# Patient Record
Sex: Male | Born: 1995 | Hispanic: No | Marital: Single | Smoking: Former smoker
Health system: Southern US, Community
[De-identification: ages and names within clinical notes are randomized; demographics above are authoritative.]

## PROBLEM LIST (undated history)

## (undated) HISTORY — PX: FRACTURE SURGERY: SHX138

## (undated) HISTORY — PX: APPENDECTOMY: SHX54

---
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
## 2015-05-18 ENCOUNTER — Other Ambulatory Visit: Payer: Self-pay | Admitting: Orthopaedic Surgery

## 2015-05-18 DIAGNOSIS — M25562 Pain in left knee: Secondary | ICD-10-CM

## 2015-05-27 ENCOUNTER — Ambulatory Visit
Admission: RE | Admit: 2015-05-27 | Discharge: 2015-05-27 | Disposition: A | Discharge status: Home / Self Care | Payer: Medicaid Other | Source: Ambulatory Visit | Attending: Orthopaedic Surgery | Admitting: Orthopaedic Surgery

## 2015-05-27 DIAGNOSIS — M25562 Pain in left knee: Secondary | ICD-10-CM

## 2016-12-22 ENCOUNTER — Emergency Department (HOSPITAL_BASED_OUTPATIENT_CLINIC_OR_DEPARTMENT_OTHER)
Admission: EM | Admit: 2016-12-22 | Discharge: 2016-12-22 | Disposition: A | Discharge status: Home / Self Care | Payer: Worker's Compensation | Attending: Emergency Medicine | Admitting: Emergency Medicine

## 2016-12-22 ENCOUNTER — Encounter (HOSPITAL_BASED_OUTPATIENT_CLINIC_OR_DEPARTMENT_OTHER): Payer: Self-pay | Admitting: *Deleted

## 2016-12-22 ENCOUNTER — Other Ambulatory Visit: Payer: Self-pay

## 2016-12-22 DIAGNOSIS — Y990 Civilian activity done for income or pay: Secondary | ICD-10-CM | POA: Diagnosis not present

## 2016-12-22 DIAGNOSIS — F17200 Nicotine dependence, unspecified, uncomplicated: Secondary | ICD-10-CM | POA: Diagnosis not present

## 2016-12-22 DIAGNOSIS — S0551XA Penetrating wound with foreign body of right eyeball, initial encounter: Secondary | ICD-10-CM

## 2016-12-22 DIAGNOSIS — W458XXA Other foreign body or object entering through skin, initial encounter: Secondary | ICD-10-CM | POA: Diagnosis not present

## 2016-12-22 DIAGNOSIS — T1501XA Foreign body in cornea, right eye, initial encounter: Secondary | ICD-10-CM | POA: Insufficient documentation

## 2016-12-22 DIAGNOSIS — Y939 Activity, unspecified: Secondary | ICD-10-CM | POA: Diagnosis not present

## 2016-12-22 DIAGNOSIS — Y929 Unspecified place or not applicable: Secondary | ICD-10-CM | POA: Diagnosis not present

## 2016-12-22 DIAGNOSIS — S0591XA Unspecified injury of right eye and orbit, initial encounter: Secondary | ICD-10-CM | POA: Diagnosis present

## 2016-12-22 MED ORDER — TETRACAINE HCL 0.5 % OP SOLN
OPHTHALMIC | Status: AC
  Administered 2016-12-22: 2 [drp] via OPHTHALMIC
  Filled 2016-12-22: qty 4

## 2016-12-22 MED ORDER — ERYTHROMYCIN 5 MG/GM OP OINT
TOPICAL_OINTMENT | Freq: Once | OPHTHALMIC | Status: AC
  Administered 2016-12-22: 1 via OPHTHALMIC
  Filled 2016-12-22: qty 3.5

## 2016-12-22 MED ORDER — TETRACAINE HCL 0.5 % OP SOLN
2.0000 [drp] | Freq: Once | OPHTHALMIC | Status: AC
  Administered 2016-12-22: 2 [drp] via OPHTHALMIC

## 2016-12-22 MED ORDER — FLUORESCEIN SODIUM 1 MG OP STRP
1.0000 | ORAL_STRIP | Freq: Once | OPHTHALMIC | Status: AC
  Administered 2016-12-22: 1 via OPHTHALMIC

## 2016-12-22 MED ORDER — FLUORESCEIN SODIUM 0.6 MG OP STRP
ORAL_STRIP | OPHTHALMIC | Status: AC
  Administered 2016-12-22: 1 via OPHTHALMIC
  Filled 2016-12-22: qty 1

## 2016-12-22 NOTE — ED Triage Notes (Signed)
Metal shaving in his right eye. Workman's comp. Pt states work does not require UDS.

## 2016-12-22 NOTE — ED Notes (Signed)
ED Provider at bedside. 

## 2016-12-22 NOTE — ED Provider Notes (Signed)
[facility] EMERGENCY DEPARTMENT Provider Note   CSN: [id number] Arrival date & time: 12/22/16  1830     History   Chief Complaint Chief Complaint  Patient presents with  . Eye Injury    HPI Johnny Reese is a 21 y.o. male.  HPI Johnny Reese is a 21 y.o. male presents to emergency department complaining of pain to the right eye.  Patient states he was at work, working with metal, states he was cutting it when shavings flew into his right eye.  He states he was wearing protective eyewear.  He reports pain and discomfort in the right eye.  He states he washed it out with water.  He states pain did not improve.  He does not wear contacts or glasses.  He reports a little bit of blurred vision.  Denies any eye drainage.  Denies any photophobia.  No other treatment prior to coming in.  History reviewed. No pertinent past medical history.  There are no active problems to display for this patient.   Past Surgical History:  Procedure Laterality Date  . FRACTURE SURGERY         Home Medications    Prior to Admission medications   Medication Sig Start Date End Date Taking? Authorizing Provider  amoxicillin-clavulanate (AUGMENTIN) 500-125 MG per tablet Take 1 tablet (500 mg total) by mouth every 8 (eight) hours. 06/24/14   Geoffery Lyons, MD  cephALEXin (KEFLEX) 500 MG capsule Take 500 mg by mouth 4 (four) times daily.    [provider]  cephALEXin (KEFLEX) 500 MG capsule Take 1 capsule (500 mg total) by mouth 4 (four) times daily. 11/08/11   Carleene Cooper, MD  HYDROcodone-acetaminophen [facility]) 5-325 MG per tablet Take 1-2 tablets by mouth every 6 (six) hours as needed. 06/24/14   Geoffery Lyons, MD  meloxicam (MOBIC) 15 MG tablet Take 15 mg by mouth daily.    [provider]    Family History No family history on file.  Social History Social History   Tobacco Use  . Smoking status: Current Some Day Smoker  . Smokeless tobacco: Never  Used  Substance Use Topics  . Alcohol use: Yes    Comment: occ  . Drug use: Yes    Types: Marijuana     Allergies   Patient has no known allergies.   Review of Systems Review of Systems   Physical Exam Updated Vital Signs BP 131/82 (BP Location: Left Arm)   Pulse 76   Temp 98.4 F (36.9 C)   Resp 18   Ht 6' (1.829 m)   Wt 77.1 kg (170 lb)   SpO2 100%   BMI 23.06 kg/m   Physical Exam  Constitutional: He appears well-developed and well-nourished. No distress.  HENT:  Head: Normocephalic and atraumatic.  Eyes: Conjunctivae and EOM are normal. Pupils are equal, round, and reactive to light. Right eye exhibits no discharge. Left eye exhibits no discharge.  Several tiny metal specs in the right eye cornea.  Neck: Neck supple.  Cardiovascular: Normal rate, regular rhythm and normal heart sounds.  Pulmonary/Chest: Effort normal. No respiratory distress. He has no wheezes. He has no rales.  Musculoskeletal: He exhibits no edema.  Neurological: He is alert.  Skin: Skin is warm and dry.  Nursing note and vitals reviewed.    ED Treatments / Results  Labs (all labs ordered are listed, but only abnormal results are displayed) Labs Reviewed - No data to display  EKG  EKG Interpretation  None       Radiology No results found.  Procedures .Foreign Body Removal Date/Time: 12/22/2016 7:58 PM Performed by: Jaynie CrumbleKirichenko, Olsen Mccutchan, PA-C Authorized by: Jaynie CrumbleKirichenko, Vennessa Affinito, PA-C  Consent: Verbal consent obtained. Consent given by: patient Patient understanding: patient states understanding of the procedure being performed Patient consent: the patient's understanding of the procedure matches consent given Patient identity confirmed: verbally with patient Body area: eye Location details: right cornea  Anesthesia: Local Anesthetic: tetracaine drops Localization method: eyelid eversion, magnification, visualized and slit lamp Removal mechanism: irrigation and moist cotton  swab Eye examined with fluorescein. Corneal abrasion size: small Corneal abrasion location: lateral No residual rust ring present. Dressing: antibiotic ointment Complexity: complex Post-procedure assessment: residual foreign bodies remain Comments: Removed two tiny metal specs, some punctate bodies remain   (including critical care time)  Medications Ordered in ED Medications  tetracaine (PONTOCAINE) 0.5 % ophthalmic solution 2 drop (not administered)  fluorescein ophthalmic strip 1 strip (not administered)     Initial Impression / Assessment and Plan / ED Course  I have reviewed the triage vital signs and the nursing notes.  Pertinent labs & imaging results that were available during my care of the patient were reviewed by me and considered in my medical decision making (see chart for details).    Patient emergency department with foreign body to the right eye.  I was able to remove 2 tiny specks, however I think there might be some very very very small mental bodies that remain in the eye.  No evidence of globe injury.  Will discharge home with erythromycin ointment and follow-up with ophthalmology tomorrow.  Patient agrees to the plan.  Visual acuity 20/20 bilaterally  Vitals:   12/22/16 1842 12/22/16 1844  BP:  131/82  Pulse:  76  Resp:  18  Temp:  98.4 F (36.9 C)  SpO2:  100%  Weight: 77.1 kg (170 lb)   Height: 6' (1.829 m)      Final Clinical Impressions(s) / ED Diagnoses   Final diagnoses:  Foreign body in lens of right eye, initial encounter    ED Discharge Orders    None       Jaynie CrumbleKirichenko, Andruw Battie, PA-C 12/23/16 0016    Maia PlanLong, Joshua G, MD 12/23/16 [id number]

## 2016-12-22 NOTE — Discharge Instructions (Signed)
Erythromycin ointment every 6 hours.  Please follow-up with eye specialist tomorrow if symptoms not improved.
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
## 2021-06-09 ENCOUNTER — Emergency Department (HOSPITAL_BASED_OUTPATIENT_CLINIC_OR_DEPARTMENT_OTHER): Payer: Self-pay

## 2021-06-09 ENCOUNTER — Encounter (HOSPITAL_BASED_OUTPATIENT_CLINIC_OR_DEPARTMENT_OTHER): Payer: Self-pay | Admitting: Emergency Medicine

## 2021-06-09 ENCOUNTER — Other Ambulatory Visit: Payer: Self-pay

## 2021-06-09 ENCOUNTER — Emergency Department (HOSPITAL_BASED_OUTPATIENT_CLINIC_OR_DEPARTMENT_OTHER)
Admission: EM | Admit: 2021-06-09 | Discharge: 2021-06-10 | Disposition: A | Discharge status: Home / Self Care | Payer: Self-pay | Attending: Emergency Medicine | Admitting: Emergency Medicine

## 2021-06-09 DIAGNOSIS — T18128A Food in esophagus causing other injury, initial encounter: Secondary | ICD-10-CM | POA: Insufficient documentation

## 2021-06-09 DIAGNOSIS — K222 Esophageal obstruction: Secondary | ICD-10-CM | POA: Insufficient documentation

## 2021-06-09 DIAGNOSIS — D72829 Elevated white blood cell count, unspecified: Secondary | ICD-10-CM | POA: Insufficient documentation

## 2021-06-09 DIAGNOSIS — X58XXXA Exposure to other specified factors, initial encounter: Secondary | ICD-10-CM | POA: Insufficient documentation

## 2021-06-09 LAB — CBC WITH DIFFERENTIAL/PLATELET
Abs Immature Granulocytes: 0.03 10*3/uL (ref 0.00–0.07)
Basophils Absolute: 0.1 10*3/uL (ref 0.0–0.1)
Basophils Relative: 1 %
Eosinophils Absolute: 0.2 10*3/uL (ref 0.0–0.5)
Eosinophils Relative: 2 %
HCT: 43.1 % (ref 39.0–52.0)
Hemoglobin: 15.1 g/dL (ref 13.0–17.0)
Immature Granulocytes: 0 %
Lymphocytes Relative: 13 %
Lymphs Abs: 1.8 10*3/uL (ref 0.7–4.0)
MCH: 31.7 pg (ref 26.0–34.0)
MCHC: 35 g/dL (ref 30.0–36.0)
MCV: 90.4 fL (ref 80.0–100.0)
Monocytes Absolute: 1 10*3/uL (ref 0.1–1.0)
Monocytes Relative: 8 %
Neutro Abs: 10.4 10*3/uL — ABNORMAL HIGH (ref 1.7–7.7)
Neutrophils Relative %: 76 %
Platelets: 275 10*3/uL (ref 150–400)
RBC: 4.77 MIL/uL (ref 4.22–5.81)
RDW: 12.3 % (ref 11.5–15.5)
WBC: 13.5 10*3/uL — ABNORMAL HIGH (ref 4.0–10.5)
nRBC: 0 % (ref 0.0–0.2)

## 2021-06-09 LAB — COMPREHENSIVE METABOLIC PANEL
ALT: 20 U/L (ref 0–44)
AST: 20 U/L (ref 15–41)
Albumin: 4.9 g/dL (ref 3.5–5.0)
Alkaline Phosphatase: 49 U/L (ref 38–126)
Anion gap: 7 (ref 5–15)
BUN: 18 mg/dL (ref 6–20)
CO2: 26 mmol/L (ref 22–32)
Calcium: 9.7 mg/dL (ref 8.9–10.3)
Chloride: 106 mmol/L (ref 98–111)
Creatinine, Ser: 1.09 mg/dL (ref 0.61–1.24)
GFR, Estimated: >60 mL/min (ref >60)
Glucose, Bld: 89 mg/dL (ref 70–99)
Potassium: 3.7 mmol/L (ref 3.5–5.1)
Sodium: 139 mmol/L (ref 135–145)
Total Bilirubin: 1 mg/dL (ref 0.3–1.2)
Total Protein: 7.8 g/dL (ref 6.5–8.1)

## 2021-06-09 NOTE — ED Triage Notes (Signed)
Pt reports he ate some steak last night and feels like some is stuck in his throat; he has been unable to eat and drink since then ?

## 2021-06-10 MED ORDER — SODIUM CHLORIDE 0.9 % IV BOLUS
500.0000 mL | Freq: Once | INTRAVENOUS | Status: AC
  Administered 2021-06-10: 500 mL via INTRAVENOUS

## 2021-06-10 MED ORDER — GLUCAGON HCL RDNA (DIAGNOSTIC) 1 MG IJ SOLR
INTRAMUSCULAR | Status: AC
  Administered 2021-06-10: 0.5 mg via INTRAVENOUS
  Filled 2021-06-10: qty 1

## 2021-06-10 MED ORDER — PANTOPRAZOLE SODIUM 40 MG PO TBEC: 40.0000 mg | DELAYED_RELEASE_TABLET | Freq: Every day | ORAL | 0 refills | Status: AC

## 2021-06-10 MED ORDER — GLUCAGON HCL RDNA (DIAGNOSTIC) 1 MG IJ SOLR: 0.5000 mg | Freq: Once | INTRAMUSCULAR | Status: AC

## 2021-06-10 NOTE — Discharge Instructions (Signed)
Be very careful when eating your food intake very small bites make sure it is chewed well.  If you get a blockage in your throat again it is important for you to go to the emergency department to get this evaluated if you cannot clear it quickly on your own. ? ?Get rechecked immediately if you develop fevers, difficulty breathing, severe chest pain or new concerning symptoms. ?

## 2021-06-10 NOTE — ED Provider Notes (Signed)
?[facility] ?Provider Note ? ? ?CSN: [id number] ?Arrival date & time: 06/09/21  2151 ? ?  ? ?History ? ?Chief Complaint  ?Patient presents with  ? Food Bolus  ? ? ?Johnny Reese is a 26 y.o. male. ? ?The history is provided by the patient.  ?Johnny Reese is a 26 y.o. male who presents to the Emergency Department complaining of food bolus.  He presents to the ED for evaluation of food bolus that occurred on Saturday night.  He was eating steak and felt like it got stuck.  He was able to get some of it back out.  We he swallows water it feels like it gets stuck just above his sternal notch and then it comes back up.  He can keep some saliva down.   ? ?He has had some similar episodes 1-2 times per year that he can normally clear. ? ?No known medical problems.   ?  ? ?Home Medications ?Prior to Admission medications   ?Medication Sig Start Date End Date Taking? Authorizing Provider  ?pantoprazole (PROTONIX) 40 MG tablet Take 1 tablet (40 mg total) by mouth daily. 06/10/21  Yes Quintella Reichert, MD  ?amoxicillin-clavulanate (AUGMENTIN) 500-125 MG per tablet Take 1 tablet (500 mg total) by mouth every 8 (eight) hours. 06/24/14   Veryl Speak, MD  ?cephALEXin (KEFLEX) 500 MG capsule Take 500 mg by mouth 4 (four) times daily.    [provider]  ?cephALEXin (KEFLEX) 500 MG capsule Take 1 capsule (500 mg total) by mouth 4 (four) times daily. 11/08/11   Mylinda Latina, MD  ?HYDROcodone-acetaminophen [facility]) 5-325 MG per tablet Take 1-2 tablets by mouth every 6 (six) hours as needed. 06/24/14   Veryl Speak, MD  ?meloxicam (MOBIC) 15 MG tablet Take 15 mg by mouth daily.    [provider]  ?   ? ?Allergies    ?Patient has no known allergies.   ? ?Review of Systems   ?Review of Systems  ?All other systems reviewed and are negative. ? ?Physical Exam ?Updated Vital Signs ?BP 131/66   Pulse (!) 58   Temp 98 ?F (36.7 ?C) (Oral)   Resp 16   Ht 5\' 11"  (1.803 m)   Wt 72.6  kg   SpO2 100%   BMI 22.32 kg/m?  ?Physical Exam ?Vitals and nursing note reviewed.  ?Constitutional:   ?   Appearance: He is well-developed.  ?HENT:  ?   Head: Normocephalic and atraumatic.  ?Cardiovascular:  ?   Rate and Rhythm: Normal rate and regular rhythm.  ?   Heart sounds: No murmur heard. ?Pulmonary:  ?   Effort: Pulmonary effort is normal. No respiratory distress.  ?   Breath sounds: Normal breath sounds.  ?Abdominal:  ?   Palpations: Abdomen is soft.  ?   Tenderness: There is no abdominal tenderness. There is no guarding or rebound.  ?Musculoskeletal:     ?   General: No tenderness.  ?Skin: ?   General: Skin is warm and dry.  ?Neurological:  ?   Mental Status: He is alert and oriented to person, place, and time.  ?Psychiatric:     ?   Behavior: Behavior normal.  ? ? ?ED Results / Procedures / Treatments   ?Labs ?(all labs ordered are listed, but only abnormal results are displayed) ?Labs Reviewed  ?CBC WITH DIFFERENTIAL/PLATELET - Abnormal; Notable for the following components:  ?    Result Value  ? WBC 13.5 (*)   ?  Neutro Abs 10.4 (*)   ? All other components within normal limits  ?COMPREHENSIVE METABOLIC PANEL  ? ? ?EKG ?None ? ?Radiology ?DG Chest Portable 1 View ? ?Result Date: 06/09/2021 ?CLINICAL DATA:  Suspected food bolus, 8 steak last night and feels like some stuck in his throat. EXAM: PORTABLE CHEST 1 VIEW COMPARISON:  None. FINDINGS: The heart size and mediastinal contours are within normal limits. Both lungs are clear. The visualized skeletal structures are unremarkable. IMPRESSION: No active disease. Electronically Signed   By: Keane Police D.O.   On: 06/09/2021 22:31   ? ?Procedures ?Procedures  ? ? ?Medications Ordered in ED ?Medications  ?sodium chloride 0.9 % bolus 500 mL (500 mLs Intravenous New Bag/Given 06/10/21 0103)  ?glucagon (human recombinant) (GLUCAGEN) injection 0.5 mg (0.5 mg Intravenous Given 06/10/21 0102)  ? ? ?ED Course/ Medical Decision Making/ A&P ?  ?                         ?Medical Decision Making ?Risk ?Prescription drug management. ? ? ?Patient here for evaluation of steak stuck in his throat that happened on Saturday night.  He was treated with glucagon in the emergency department and he was able to pass the obstructed steak.  Patient able to swallow water without difficulty on reevaluation.  Discussed with patient importance of GI follow-up.  Will start PPI.  Discussed return precautions. ? ?Current clinical picture is not consistent with Boerhaave's esophagus.  CBC with mild leukocytosis, likely demargination due to stress.  He was treated with IV fluid hydration due to inability to tolerate oral fluids for 24 hours. ? ? ? ? ? ? ?Final Clinical Impression(s) / ED Diagnoses ?Final diagnoses:  ?Esophageal obstruction due to food impaction  ? ? ?Rx / DC Orders ?ED Discharge Orders   ? ?      Ordered  ?  pantoprazole (PROTONIX) 40 MG tablet  Daily       ? 06/10/21 0136  ? ?  ?  ? ?  ? ? ?  ?Quintella Reichert, MD ?06/10/21 0139 ? ?

## 2021-06-10 NOTE — ED Notes (Signed)
Patient discharged to home.  All discharge instructions reviewed.  Patient verbalized understanding via teachback method.  VS WDL.  Respirations even and unlabored.  Ambulatory out of ED.   °

## 2023-12-02 IMAGING — DX DG CHEST 1V PORT
2 series · 2 of 2 positions shown · non-contrast
Comparison: None.

CLINICAL DATA: Suspected food bolus, 8 steak last night and feels
like some stuck in his throat.

EXAM:
PORTABLE CHEST 1 VIEW

[chest ap (1 of 2)]
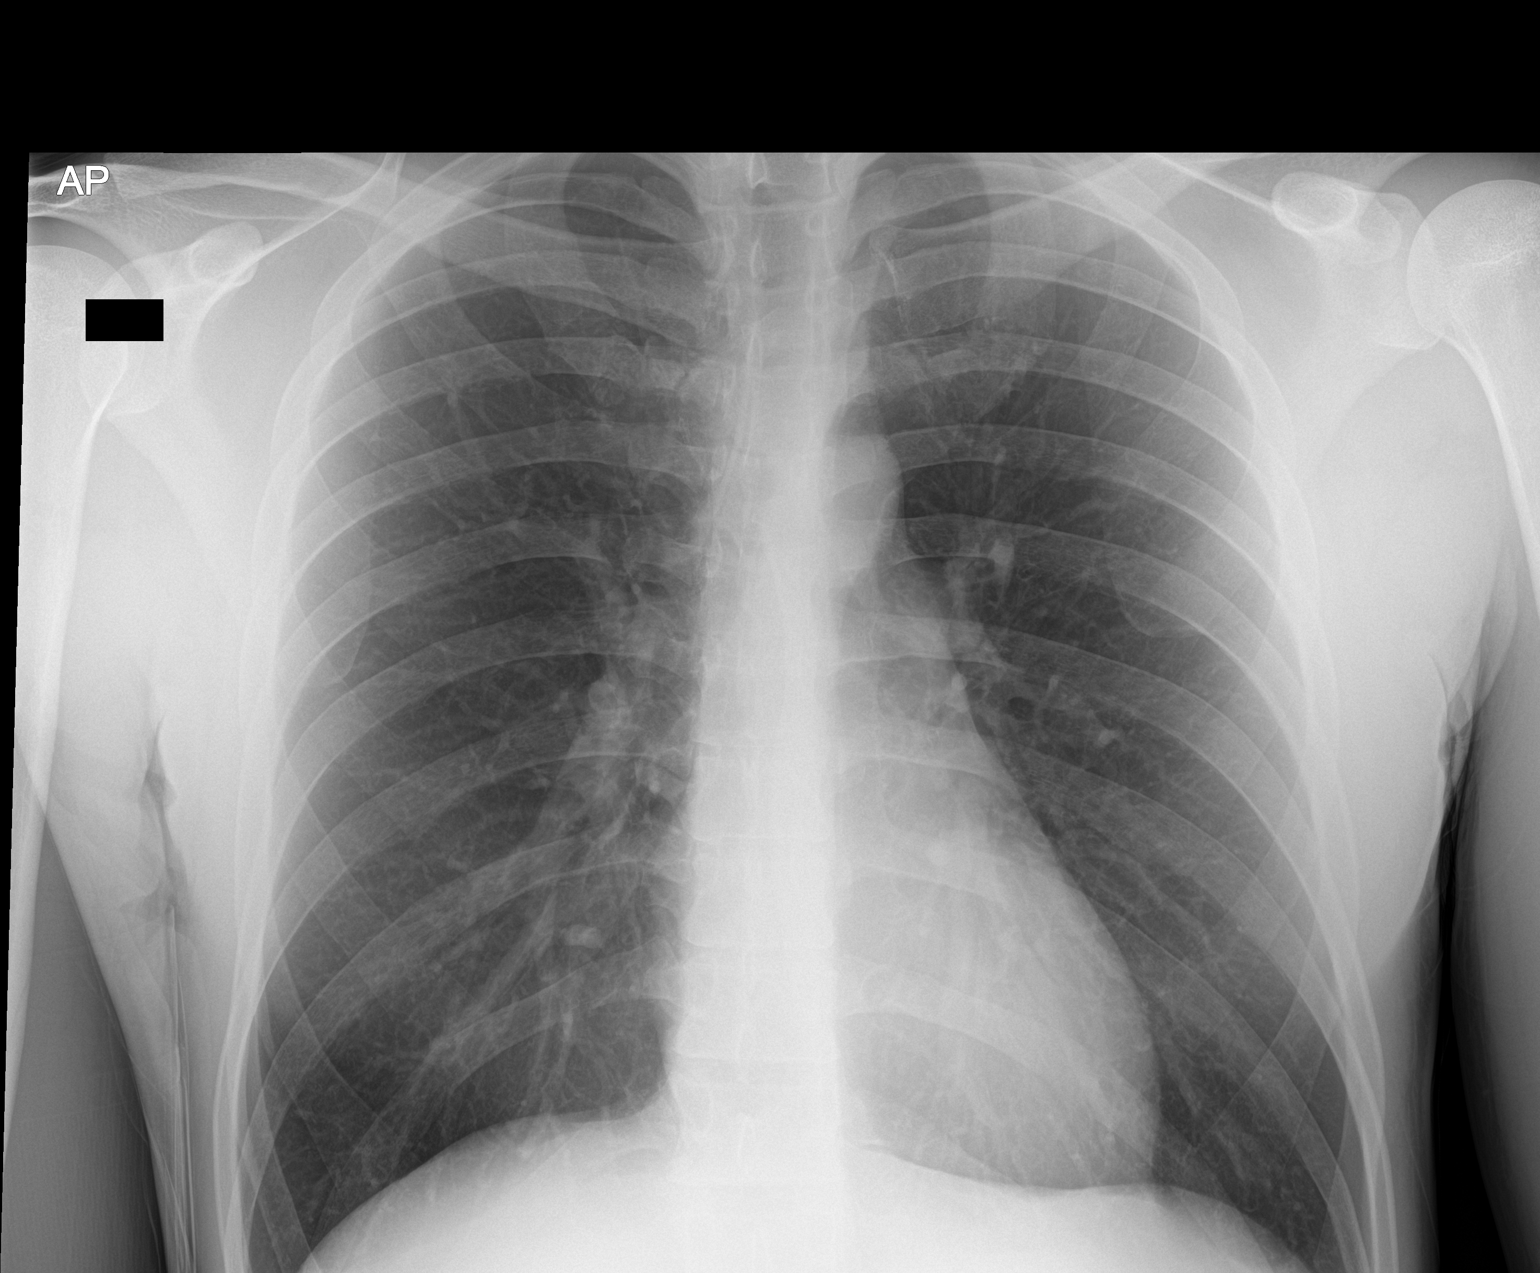

[chest ap (2 of 2)]
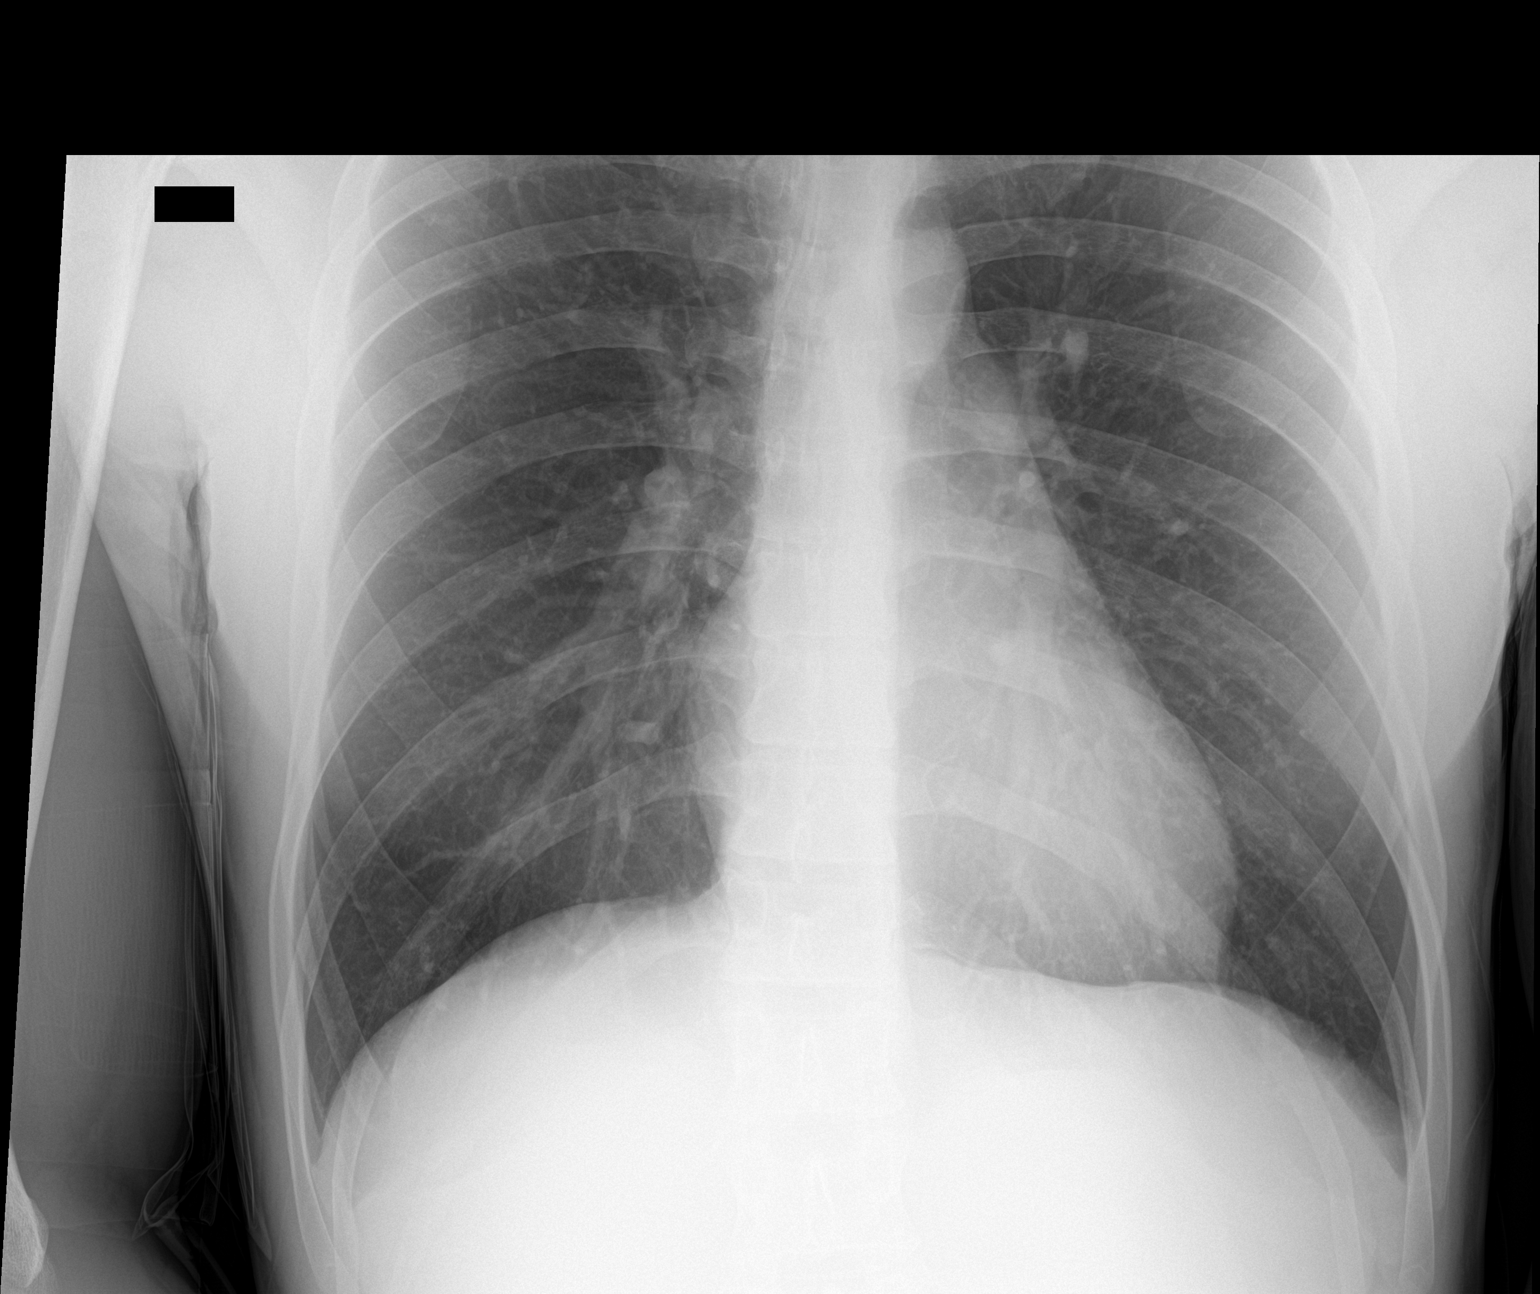

[2 of 2 positions shown; findings below may reference images not displayed]

FINDINGS: The heart size and mediastinal contours are within normal limits.
Both lungs are clear. The visualized skeletal structures are
unremarkable.
IMPRESSION: No active disease.
